# Patient Record
Sex: Female | Born: 1995 | Race: White | Hispanic: Yes | Marital: Single | Smoking: Never smoker
Health system: Southern US, Community
[De-identification: ages and names within clinical notes are randomized; demographics above are authoritative.]

## PROBLEM LIST (undated history)

## (undated) HISTORY — PX: APPENDECTOMY: SHX54

---
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
## 2018-05-04 DIAGNOSIS — J309 Allergic rhinitis, unspecified: Secondary | ICD-10-CM | POA: Diagnosis not present

## 2018-05-04 DIAGNOSIS — H6692 Otitis media, unspecified, left ear: Secondary | ICD-10-CM | POA: Diagnosis not present

## 2018-10-12 ENCOUNTER — Encounter: Payer: Self-pay | Admitting: Certified Nurse Midwife

## 2018-10-12 ENCOUNTER — Ambulatory Visit (INDEPENDENT_AMBULATORY_CARE_PROVIDER_SITE_OTHER): Payer: BLUE CROSS/BLUE SHIELD | Admitting: Certified Nurse Midwife

## 2018-10-12 VITALS — BP 95/64 | HR 61 | Ht 62.0 in | Wt 139.8 lb

## 2018-10-12 DIAGNOSIS — N898 Other specified noninflammatory disorders of vagina: Secondary | ICD-10-CM

## 2018-10-12 DIAGNOSIS — Z01419 Encounter for gynecological examination (general) (routine) without abnormal findings: Secondary | ICD-10-CM

## 2018-10-12 DIAGNOSIS — Z113 Encounter for screening for infections with a predominantly sexual mode of transmission: Secondary | ICD-10-CM

## 2018-10-12 DIAGNOSIS — Z124 Encounter for screening for malignant neoplasm of cervix: Secondary | ICD-10-CM

## 2018-10-12 DIAGNOSIS — N939 Abnormal uterine and vaginal bleeding, unspecified: Secondary | ICD-10-CM | POA: Insufficient documentation

## 2018-10-12 LAB — POCT URINE PREGNANCY: Preg Test, Ur: NEGATIVE

## 2018-10-12 NOTE — Progress Notes (Signed)
GYNECOLOGY ANNUAL PREVENTATIVE CARE ENCOUNTER NOTE  Subjective:   Tillman AbideMaria Wynder is a 23 y.o. G0P0000 female here for a routine annual gynecologic exam.  Current complaints: occasional pelvic pain and AUB.   Denies discharge, problems with intercourse or other gynecologic concerns. Patient request STD screening.    Gynecologic History Patient's last menstrual period was 08/08/2018 (exact date). Contraception: none Never had pap prior to today   Obstetric History OB History  Gravida Para Term Preterm AB Living  0 0 0 0 0 0  SAB TAB Ectopic Multiple Live Births  0 0 0 0 0    History reviewed. No pertinent past medical history.  Past Surgical History:  Procedure Laterality Date  . APPENDECTOMY      Current Outpatient Medications on File Prior to Visit  Medication Sig Dispense Refill  . predniSONE (DELTASONE) 20 MG tablet Take one pill in AM and two pills in PM for 5 days, then two pills once a day for five days then 1 pill daily for 4 days. (Patient not taking: Reported on 10/12/2018) 34 tablet 0   No current facility-administered medications on file prior to visit.     No Known Allergies  Social History:  reports that she has never smoked. She has never used smokeless tobacco. She reports that she does not drink alcohol or use drugs.  Family History  Problem Relation Age of Onset  . Heart disease Maternal Grandmother   . Diabetes Paternal Grandmother     The following portions of the patient's history were reviewed and updated as appropriate: allergies, current medications, past family history, past medical history, past social history, past surgical history and problem list.  Review of Systems Pertinent items noted in HPI and remainder of comprehensive ROS otherwise negative.   Objective:  BP 95/64   Pulse 61   Ht 5\' 2"  (1.575 m)   Wt 139 lb 12.8 oz (63.4 kg)   LMP 08/08/2018 (Exact Date) Comment: i  BMI 25.57 kg/m  CONSTITUTIONAL: Well-developed,  well-nourished female in no acute distress.  HENT:  Normocephalic, atraumatic, External right and left ear normal. Oropharynx is clear and moist EYES: Conjunctivae and EOM are normal. Pupils are equal, round, and reactive to light.  NECK: Normal range of motion, supple, no masses.  Normal thyroid.  SKIN: Skin is warm and dry. No rash noted. Not diaphoretic. No erythema. No pallor. MUSCULOSKELETAL: Normal range of motion. No tenderness.  No cyanosis, clubbing, or edema.  2+ distal pulses. NEUROLOGIC: Alert and oriented to person, place, and time. Normal reflexes, muscle tone coordination. No cranial nerve deficit noted. PSYCHIATRIC: Normal mood and affect. Normal behavior. Normal judgment and thought content. CARDIOVASCULAR: Normal heart rate noted, regular rhythm RESPIRATORY: Clear to auscultation bilaterally. Effort and breath sounds normal, no problems with respiration noted. BREASTS: Symmetric in size. No masses, skin changes, nipple drainage, or lymphadenopathy. ABDOMEN: Soft, normal bowel sounds, no distention noted.  No tenderness, rebound or guarding.  PELVIC: Normal appearing external genitalia; normal appearing vaginal mucosa and cervix.  Small amount of white thin discharge noted.  Pap smear obtained.  Normal uterine size, no other palpable masses, no uterine or adnexal tenderness.  Assessment and Plan:  1. Encounter for annual routine gynecological examination - Normal well woman examination  - Reports intense pelvic cramping each month with variation of cycles  - Birth control options discussed in detail for AUB and painful cycles, patient declines   2. Screening for cervical cancer - Cytology - PAP( [facility])  3. Screening examination for STD (sexually transmitted disease) - Patient request STD screening  - Hepatitis B surface antigen - Hepatitis C antibody - HIV Antibody (routine testing w rflx) - RPR  4. Abnormal uterine bleeding (AUB) - patient reports hx of AUB  since menarche  - Was on OCP which made irregular periods worse per patient  - Educated and discussed other birth control options in detail, patient declines and does not want to be on [facility].  - POCT urine pregnancy  Will follow up results of pap smear and manage accordingly. Routine preventative health maintenance measures emphasized. Please refer to After Visit Summary for other counseling recommendations.    Sharyon Cable, CNM [facility] [employer], [facility]

## 2018-10-12 NOTE — Progress Notes (Signed)
NGYN patient presents for Annual Exam.  CC: Pelvic Pain that is just random pain will be 6/10x, pt describes pain as "aching". Pt denies any dysuria symptoms     LMP : 08/08/2018 irregular periods  Contraception: None and none desired  STD Screening: Desires Full Panel.

## 2018-10-13 LAB — HEPATITIS B SURFACE ANTIGEN: Hepatitis B Surface Ag: NEGATIVE

## 2018-10-13 LAB — RPR: RPR Ser Ql: NONREACTIVE

## 2018-10-13 LAB — HIV ANTIBODY (ROUTINE TESTING W REFLEX): HIV Screen 4th Generation wRfx: NONREACTIVE

## 2018-10-13 LAB — HEPATITIS C ANTIBODY: Hep C Virus Ab: <0.1 s/co ratio (ref 0.0–0.9)

## 2018-10-17 LAB — CYTOLOGY - PAP
Bacterial vaginitis: NEGATIVE
Candida vaginitis: NEGATIVE
Chlamydia: NEGATIVE
Diagnosis: NEGATIVE
Neisseria Gonorrhea: NEGATIVE
Trichomonas: NEGATIVE

## 2019-07-17 ENCOUNTER — Other Ambulatory Visit: Payer: Self-pay

## 2019-07-18 ENCOUNTER — Other Ambulatory Visit (HOSPITAL_COMMUNITY)
Admission: RE | Admit: 2019-07-18 | Discharge: 2019-07-18 | Disposition: A | Discharge status: Home / Self Care | Payer: BC Managed Care – PPO | Source: Ambulatory Visit | Attending: Certified Nurse Midwife | Admitting: Certified Nurse Midwife

## 2019-07-18 ENCOUNTER — Encounter: Payer: Self-pay | Admitting: Certified Nurse Midwife

## 2019-07-18 ENCOUNTER — Other Ambulatory Visit: Payer: Self-pay

## 2019-07-18 ENCOUNTER — Ambulatory Visit (INDEPENDENT_AMBULATORY_CARE_PROVIDER_SITE_OTHER): Payer: BC Managed Care – PPO | Admitting: Certified Nurse Midwife

## 2019-07-18 VITALS — BP 110/64 | HR 64 | Temp 97.1°F | Resp 16 | Ht 61.75 in | Wt 134.0 lb

## 2019-07-18 DIAGNOSIS — Z113 Encounter for screening for infections with a predominantly sexual mode of transmission: Secondary | ICD-10-CM

## 2019-07-18 DIAGNOSIS — Z01419 Encounter for gynecological examination (general) (routine) without abnormal findings: Secondary | ICD-10-CM

## 2019-07-18 DIAGNOSIS — Z124 Encounter for screening for malignant neoplasm of cervix: Secondary | ICD-10-CM | POA: Insufficient documentation

## 2019-07-18 DIAGNOSIS — Z0000 Encounter for general adult medical examination without abnormal findings: Secondary | ICD-10-CM

## 2019-07-18 DIAGNOSIS — R8290 Unspecified abnormal findings in urine: Secondary | ICD-10-CM

## 2019-07-18 LAB — POCT URINALYSIS DIPSTICK
Bilirubin, UA: NEGATIVE
Glucose, UA: NEGATIVE
Ketones, UA: NEGATIVE
Leukocytes, UA: NEGATIVE
Nitrite, UA: POSITIVE
Protein, UA: POSITIVE — AB
Urobilinogen, UA: NEGATIVE E.U./dL — AB
pH, UA: 5 (ref 5.0–8.0)

## 2019-07-18 NOTE — Progress Notes (Addendum)
23 y.o. G0P0000 Single  Hispanic Fe here to establish gyn care and for annual exam. Contraception is Withdrawal, which has worked well for 2 years. Periods are usually monthly and has some irregular periods of missing 1-2 months in the past year.Marland Kitchen Has history of no period for 5 months when she was underweight only. Much better now. Has noted some Urinary frequency and urine odor the past few days. No fever, chills or back pain or blood in urine. Has never had STD screening she has only been one partner, but he has been with more than patient. Request STD screening. No other health concerns today.  Patient's last menstrual period was 07/14/2019 (exact date).          Sexually active: Yes.    The current method of family planning is withdrawal    Exercising: No.  exercise Smoker:  no  Review of Systems  Constitutional:       Irregular menstrual cycles  Genitourinary:       Urine odor    Health Maintenance: Pap:  2019 neg per patient History of Abnormal Pap: no MMG:  none Self Breast exams: no Colonoscopy:  none BMD:   none TDaP:  UTD Shingles: no Pneumonia: no Hep C and HIV: not done Labs: poct urine-nitrite +, rbc 2+(on cycle), protein+   reports that she has never smoked. She has never used smokeless tobacco. She reports that she does not drink alcohol or use drugs.  History reviewed. No pertinent past medical history.  Past Surgical History:  Procedure Laterality Date  . APPENDECTOMY      No current outpatient medications on file.   No current facility-administered medications for this visit.     Family History  Problem Relation Age of Onset  . Heart disease Maternal Grandmother   . Diabetes Paternal Grandmother     ROS:  Pertinent items are noted in HPI.  Otherwise, a comprehensive ROS was negative.  Exam:   BP 110/64   Pulse 64   Temp (!) 97.1 F (36.2 C) (Skin)   Resp 16   Ht 5' 1.75" (1.568 m)   Wt 134 lb (60.8 kg)   LMP 07/14/2019 (Exact Date)   BMI  24.71 kg/m  Height: 5' 1.75" (156.8 cm) Ht Readings from Last 3 Encounters:  07/18/19 5' 1.75" (1.568 m)  10/12/18 5\' 2"  (1.575 m)    General appearance: alert, cooperative and appears stated age Head: Normocephalic, without obvious abnormality, atraumatic Neck: no adenopathy, supple, symmetrical, trachea midline and thyroid normal to inspection and palpation Lungs: clear to auscultation bilaterally CVAT bilateral negative Breasts: normal appearance, no masses or tenderness, No nipple retraction or dimpling, No nipple discharge or bleeding, No axillary or supraclavicular adenopathy, Taught monthly breast self examination, printed information given Heart: regular rate and rhythm Abdomen: soft, non-tender; no masses,  no organomegaly, positive for suprapubic tenderness Extremities: extremities normal, atraumatic, no cyanosis or edema Skin: Skin color, texture, turgor normal. No rashes or lesions Lymph nodes: Cervical, supraclavicular, and axillary nodes normal. No abnormal inguinal nodes palpated Neurologic: Grossly normal   Pelvic: External genitalia:  no lesions              Urethra:  normal appearing urethra with no masses, or lesions  Positive for tenderness  Bladder tender, Urethra meatus tender,              Bartholin's and Skene's: normal  Vagina: normal appearing vagina with normal color and discharge, no lesions              Cervix: no cervical motion tenderness, no lesions, nulliparous appearance and blood noted from cervix              Pap taken: Yes.   Bimanual Exam:  Uterus:  normal size, contour, position, consistency, mobility, non-tender and anteverted              Adnexa: normal adnexa and no mass, fullness, tenderness               Rectovaginal: Confirms               Anus:  normal appearance, no lesions  Chaperone present: yes  A:  Well Woman with normal exam  Contraception withdrawal  History of 35-38 day menstrual cycles  UTI  STD  screening  ? Gardasil candidate  P:   Reviewed health and wellness pertinent to exam Discussed condom use also for protection.  Discussed keeping menses record and if misses more than 3 cycles needs to advise. Can consider OCP for cycle control. Patient will consider.  Discussed finding consistent with UTI and medication needed. Warning signs with UTI given, patient declined medication until lab in.  Lab: Urine micro and culture  Discussed prevention with condom use and monogamous relationship  ZOX:WRUEAV, Gc,Chlamydia, trichomonas,HIV,RPR, Hep.C  Patient to check with mother regarding vaccines and Gardasil..  Pap smear: yes  counseled on breast self exam, STD prevention, HIV risk factors and prevention, feminine hygiene, family planning choices, adequate intake of calcium and vitamin D, diet and exercise  return annually or prn  An After Visit Summary was printed and given to the patient.

## 2019-07-18 NOTE — Patient Instructions (Signed)
General topics  Next pap or exam is  due in 1 year Take a Women's multivitamin Take 1200 mg. of calcium daily - prefer dietary If any concerns in interim to call back  Breast Self-Awareness Practicing breast self-awareness may pick up problems early, prevent significant medical complications, and possibly save your life. By practicing breast self-awareness, you can become familiar with how your breasts look and feel and if your breasts are changing. This allows you to notice changes early. It can also offer you some reassurance that your breast health is good. One way to learn what is normal for your breasts and whether your breasts are changing is to do a breast self-exam. If you find a lump or something that was not present in the past, it is best to contact your caregiver right away. Other findings that should be evaluated by your caregiver include nipple discharge, especially if it is bloody; skin changes or reddening; areas where the skin seems to be pulled in (retracted); or new lumps and bumps. Breast pain is seldom associated with cancer (malignancy), but should also be evaluated by a caregiver. BREAST SELF-EXAM The best time to examine your breasts is 5 7 days after your menstrual period is over.  ExitCare Patient Information 2013 [place].   Exercise to Stay Healthy Exercise helps you become and stay healthy. EXERCISE IDEAS AND TIPS Choose exercises that:  You enjoy.  Fit into your day. You do not need to exercise really hard to be healthy. You can do exercises at a slow or medium level and stay healthy. You can:  Stretch before and after working out.  Try yoga, Pilates, or tai chi.  Lift weights.  Walk fast, swim, jog, run, climb stairs, bicycle, dance, or rollerskate.  Take aerobic classes. Exercises that burn about 150 calories:  Running 1  miles in 15 minutes.  Playing volleyball for 45 to 60 minutes.  Washing and waxing a car for 45 to 60 minutes.   Playing touch football for 45 minutes.  Walking 1  miles in 35 minutes.  Pushing a stroller 1  miles in 30 minutes.  Playing basketball for 30 minutes.  Raking leaves for 30 minutes.  Bicycling 5 miles in 30 minutes.  Walking 2 miles in 30 minutes.  Dancing for 30 minutes.  Shoveling snow for 15 minutes.  Swimming laps for 20 minutes.  Walking up stairs for 15 minutes.  Bicycling 4 miles in 15 minutes.  Gardening for 30 to 45 minutes.  Jumping rope for 15 minutes.  Washing windows or floors for 45 to 60 minutes. Document Released: 10/10/2010 Document Revised: 11/30/2011 Document Reviewed: 10/10/2010 [facility] Patient Information 2013 [place].   Other topics ( that may be useful information):    Sexually Transmitted Disease Sexually transmitted disease (STD) refers to any infection that is passed from person to person during sexual activity. This may happen by way of saliva, semen, blood, vaginal mucus, or urine. Common STDs include:  Gonorrhea.  Chlamydia.  Syphilis.  HIV/AIDS.  Genital herpes.  Hepatitis B and C.  Trichomonas.  Human papillomavirus (HPV).  Pubic lice. CAUSES  An STD may be spread by bacteria, virus, or parasite. A person can get an STD by:  Sexual intercourse with an infected person.  Sharing sex toys with an infected person.  Sharing needles with an infected person.  Having intimate contact with the genitals, mouth, or rectal areas of an infected person. SYMPTOMS  Some people may not have any symptoms, but  they can still pass the infection to others. Different STDs have different symptoms. Symptoms include:  Painful or bloody urination.  Pain in the pelvis, abdomen, vagina, anus, throat, or eyes.  Skin rash, itching, irritation, growths, or sores (lesions). These usually occur in the genital or anal area.  Abnormal vaginal discharge.  Penile discharge in men.  Soft, flesh-colored skin growths in the genital  or anal area.  Fever.  Pain or bleeding during sexual intercourse.  Swollen glands in the groin area.  Yellow skin and eyes (jaundice). This is seen with hepatitis. DIAGNOSIS  To make a diagnosis, your caregiver may:  Take a medical history.  Perform a physical exam.  Take a specimen (culture) to be examined.  Examine a sample of discharge under a microscope.  Perform blood test TREATMENT   Chlamydia, gonorrhea, trichomonas, and syphilis can be cured with antibiotic medicine.  Genital herpes, hepatitis, and HIV can be treated, but not cured, with prescribed medicines. The medicines will lessen the symptoms.  Genital warts from HPV can be treated with medicine or by freezing, burning (electrocautery), or surgery. Warts may come back.  HPV is a virus and cannot be cured with medicine or surgery.However, abnormal areas may be followed very closely by your caregiver and may be removed from the cervix, vagina, or vulva through office procedures or surgery. If your diagnosis is confirmed, your recent sexual partners need treatment. This is true even if they are symptom-free or have a negative culture or evaluation. They should not have sex until their caregiver says it is okay. HOME CARE INSTRUCTIONS  All sexual partners should be informed, tested, and treated for all STDs.  Take your antibiotics as directed. Finish them even if you start to feel better.  Only take over-the-counter or prescription medicines for pain, discomfort, or fever as directed by your caregiver.  Rest.  Eat a balanced diet and drink enough fluids to keep your urine clear or pale yellow.  Do not have sex until treatment is completed and you have followed up with your caregiver. STDs should be checked after treatment.  Keep all follow-up appointments, Pap tests, and blood tests as directed by your caregiver.  Only use latex condoms and water-soluble lubricants during sexual activity. Do not use petroleum  jelly or oils.  Avoid alcohol and illegal drugs.  Get vaccinated for HPV and hepatitis. If you have not received these vaccines in the past, talk to your caregiver about whether one or both might be right for you.  Avoid risky sex practices that can break the skin. The only way to avoid getting an STD is to avoid all sexual activity.Latex condoms and dental dams (for oral sex) will help lessen the risk of getting an STD, but will not completely eliminate the risk. SEEK MEDICAL CARE IF:   You have a fever.  You have any new or worsening symptoms. Document Released: 11/28/2002 Document Revised: 11/30/2011 Document Reviewed: 12/05/2010 [facility] Patient Information 2013 [place].    Domestic Abuse You are being battered or abused if someone close to you hits, pushes, or physically hurts you in any way. You also are being abused if you are forced into activities. You are being sexually abused if you are forced to have sexual contact of any kind. You are being emotionally abused if you are made to feel worthless or if you are constantly threatened. It is important to remember that help is available. No one has the right to abuse you. PREVENTION OF FURTHER  ABUSE  Learn the warning signs of danger. This varies with situations but may include: the use of alcohol, threats, isolation from friends and family, or forced sexual contact. Leave if you feel that violence is going to occur.  If you are attacked or beaten, report it to the police so the abuse is documented. You do not have to press charges. The police can protect you while you or the attackers are leaving. Get the officer's name and badge number and a copy of the report.  Find someone you can trust and tell them what is happening to you: your caregiver, a nurse, clergy member, close friend or family member. Feeling ashamed is natural, but remember that you have done nothing wrong. No one deserves abuse. Document Released: 09/04/2000  Document Revised: 11/30/2011 Document Reviewed: 11/13/2010 [facility] Patient Information 2013 [place].    How Much is Too Much Alcohol? Drinking too much alcohol can cause injury, accidents, and health problems. These types of problems can include:   Car crashes.  Falls.  Family fighting (domestic violence).  Drowning.  Fights.  Injuries.  Burns.  Damage to certain organs.  Having a baby with birth defects. ONE DRINK CAN BE TOO MUCH WHEN YOU ARE:  Working.  Pregnant or breastfeeding.  Taking medicines. Ask your doctor.  Driving or planning to drive. If you or someone you know has a drinking problem, get help from a doctor.  Document Released: 07/04/2009 Document Revised: 11/30/2011 Document Reviewed: 07/04/2009 [facility] Patient Information 2013 [place].   Smoking Hazards Smoking cigarettes is extremely bad for your health. Tobacco smoke has over 200 known poisons in it. There are over 60 chemicals in tobacco smoke that cause cancer. Some of the chemicals found in cigarette smoke include:   Cyanide.  Benzene.  Formaldehyde.  Methanol (wood alcohol).  Acetylene (fuel used in welding torches).  Ammonia. Cigarette smoke also contains the poisonous gases nitrogen oxide and carbon monoxide.  Cigarette smokers have an increased risk of many serious medical problems and Smoking causes approximately:  90% of all lung cancer deaths in men.  80% of all lung cancer deaths in women.  90% of deaths from chronic obstructive lung disease. Compared with nonsmokers, smoking increases the risk of:  Coronary heart disease by 2 to 4 times.  Stroke by 2 to 4 times.  Men developing lung cancer by 23 times.  Women developing lung cancer by 13 times.  Dying from chronic obstructive lung diseases by 12 times.  . Smoking is the most preventable cause of death and disease in our society.  WHY IS SMOKING ADDICTIVE?  Nicotine is the chemical agent in  tobacco that is capable of causing addiction or dependence.  When you smoke and inhale, nicotine is absorbed rapidly into the bloodstream through your lungs. Nicotine absorbed through the lungs is capable of creating a powerful addiction. Both inhaled and non-inhaled nicotine may be addictive.  Addiction studies of cigarettes and spit tobacco show that addiction to nicotine occurs mainly during the teen years, when young people begin using tobacco products. WHAT ARE THE BENEFITS OF QUITTING?  There are many health benefits to quitting smoking.   Likelihood of developing cancer and heart disease decreases. Health improvements are seen almost immediately.  Blood pressure, pulse rate, and breathing patterns start returning to normal soon after quitting. QUITTING SMOKING   American Lung Association - 1-800-LUNGUSA  American Cancer Society - 1-800-ACS-2345 Document Released: 10/15/2004 Document Revised: 11/30/2011 Document Reviewed: 06/19/2009 [facility] Patient Information 2013 [place].   Stress Management Stress is a state of physical or mental tension that often results from changes in your life or normal routine. Some common causes of stress are:  Death of a loved one.  Injuries or severe illnesses.  Getting fired or changing jobs.  Moving into a new home. Other causes may be:  Sexual problems.  Business or financial losses.  Taking on a large debt.  Regular conflict with someone at home or at work.  Constant tiredness from lack of sleep. It is not just bad things that are stressful. It may be stressful to:  Win the lottery.  Get married.  Buy a new car. The amount of stress that can be easily tolerated varies from person to person. Changes generally cause stress, regardless of the types of change. Too much stress can affect your health. It may lead to physical or emotional problems. Too little stress (boredom) may also become stressful. SUGGESTIONS TO REDUCE  STRESS:  Talk things over with your family and friends. It often is helpful to share your concerns and worries. If you feel your problem is serious, you may want to get help from a professional counselor.  Consider your problems one at a time instead of lumping them all together. Trying to take care of everything at once may seem impossible. List all the things you need to do and then start with the most important one. Set a goal to accomplish 2 or 3 things each day. If you expect to do too many in a single day you will naturally fail, causing you to feel even more stressed.  Do not use alcohol or drugs to relieve stress. Although you may feel better for a short time, they do not remove the problems that caused the stress. They can also be habit forming.  Exercise regularly - at least 3 times per week. Physical exercise can help to relieve that "uptight" feeling and will relax you.  The shortest distance between despair and hope is often a good night's sleep.  Go to bed and get up on time allowing yourself time for appointments without being rushed.  Take a short "time-out" period from any stressful situation that occurs during the day. Close your eyes and take some deep breaths. Starting with the muscles in your face, tense them, hold it for a few seconds, then relax. Repeat this with the muscles in your neck, shoulders, hand, stomach, back and legs.  Take good care of yourself. Eat a balanced diet and get plenty of rest.  Schedule time for having fun. Take a break from your daily routine to relax. HOME CARE INSTRUCTIONS   Call if you feel overwhelmed by your problems and feel you can no longer manage them on your own.  Return immediately if you feel like hurting yourself or someone else. Document Released: 03/03/2001 Document Revised: 11/30/2011 Document Reviewed: 10/24/2007 [facility] Patient Information 2013 [place].   Infeccin urinaria en los adultos Urinary Tract Infection,  Adult Una infeccin urinaria (IU) puede ocurrir en Clinical cytogeneticist de las vas [place]. Las vas urinarias incluyen lo siguiente:  Los riones.  Los urteres.  La vejiga.  La uretra. Estos rganos fabrican, almacenan y eliminan el pis (orina) del cuerpo. Cules son las causas? La causa es la presencia de grmenes (bacterias) en la zona genital. Estos grmenes proliferan y causan hinchazn (inflamacin) de las vas urinarias. Qu incrementa el riesgo? Es ms probable que contraiga esta afeccin si:  Tiene colocado un tubo delgado  y pequeo (catter) para drenar el pis.  Nopuede controlar la evacuacin de pis ni de materia fecal (incontinencia).  Es Art therapist y, adems: ? [place] estos mtodos para Therapist, occupational: ? Un medicamento que [employer] espermatozoides (espermicida). ? Un dispositivo que impide el paso de los espermatozoides (diafragma). ? Tieneniveles bajos de una hormona femenina (estrgeno). ? Est embarazada.  Tiene genes que [employer].  Es sexualmente activa.  Toma antibiticos.  Tiene dificultad para orinar debido a: ? Su prstata es ms grande de lo normal, si usted es hombre. ? Obstruccin en la parte del cuerpo que drena el pis de la vejiga (uretra). ? Clculo renal. ? Untrastorno nervioso que afecta la vejiga (vejiga neurgena). ? No bebe una cantidad suficiente de lquido. ? No hace pis con la frecuencia suficiente.  Tiene otras afecciones, como: ? Diabetes. ? Un sistema que combate las enfermedades (sistema inmunitario) debilitado. ? Anemia drepanoctica. ? Gota. ? Lesin en la columna vertebral. Cules son los signos o los sntomas? Los sntomas de esta afeccin incluyen:  Necesidad inmediata (urgente) de hacer pis.  Hacer pis con frecuencia.  Hacer poca cantidad de pis con mucha frecuencia.  Dolor o ardor al [employer].  Sangre en el pis.  Pis que huele mal o anormal.  Dificultad para hacer pis.  Pis turbio.  Lquido que  sale de la vagina, si es [place].  Dolor en la barriga o en la parte baja de la espalda. Otros sntomas pueden incluir los siguientes:  Vmitos.  No sentir deseos de comer.  Sentirse confundido (confuso).  Sentirse cansado y malhumorado (irritable).  Cristy Hilts.  Materia fecal lquida (diarrea). Cmo se trata? El tratamiento de esta afeccin puede incluir:  Antibiticos.  Otros medicamentos.  Beber una cantidad suficiente de agua. Sigue estas instrucciones en tu casa:  Medicamentos  [employer] de venta libre y los recetados solamente como se lo haya indicado el mdico.  Si le recetaron un antibitico, tmelo como se lo haya indicado el mdico. No deje de tomarlo aunque comience a sentirse mejor. Indicaciones generales  Asegrese de hacer lo siguiente: ? Haga pis hasta que la vejiga quede vaca. ? Nocontenga el pis durante mucho tiempo. ? Vace la vejiga despus de Clinical biochemist. ? Lmpiese de adelante hacia atrs despus de defecar, si es mujer. Use cada trozo de papel higinico solo una vez cuando se limpie.  Beba suficiente lquido como para Theatre manager la orina de color [place] plido.  Concurra a todas las visitas de seguimiento como se lo haya indicado el mdico. Esto es importante. Comunquese con un mdico si:  No mejora despus de 1 o 2das de tratamiento.  Los sntomas desaparecen y Teacher, adult education. Solicite ayuda inmediatamente si:  Tiene un dolor muy intenso en la espalda.  Tiene dolor muy intenso en la parte baja de la barriga.  Tener fiebre.  Tiene Higher education careers adviser (nuseas).  Tiene vmitos. Resumen  Una infeccin urinaria (IU) puede ocurrir en Clinical cytogeneticist de las vas [place].  Esta afeccin es causada por la presencia de grmenes en la zona genital.  Existen muchos factores de riesgo de sufrir una IU. Estos incluyen tener colocado un tubo delgado y pequeo para drenar el pis y no poder controlar cundo hace pis y  materia fecal.  El tratamiento incluye antibiticos contra los grmenes.  Beba suficiente lquido como para Theatre manager la orina de color [place] plido. Esta informacin no tiene Marine scientist el consejo del mdico. Asegrese de hacerle al mdico  cualquier pregunta que tenga. Document Released: 02/25/2010 Document Revised: 08/31/2018 Document Reviewed: 08/31/2018 Elsevier Patient Education  2020 [place] Sex Practicing safe sex means taking steps before and during sex to reduce your risk of:  Getting an STI (sexually transmitted infection).  Giving your partner an STI.  Unwanted or unplanned pregnancy. How can I practice safe sex?     Ways you can practice safe sex  Limit your sexual partners to only one partner who is having sex with only you.  Avoid using alcohol and drugs before having sex. Alcohol and drugs can affect your judgment.  Before having sex with a new partner: ? Talk to your partner about past partners, past STIs, and drug use. ? Get screened for STIs and discuss the results with your partner. Ask your partner to get screened, too.  Check your body regularly for sores, blisters, rashes, or unusual discharge. If you notice any of these problems, visit your health care provider.  Avoid sexual contact if you have symptoms of an infection or you are being treated for an STI.  While having sex, use a condom. Make sure to: ? Use a condom every time you have vaginal, oral, or anal sex. Both females and males should wear condoms during oral sex. ? Keep condoms in place from the beginning to the end of sexual activity. ? Use a latex condom, if possible. Latex condoms offer the best protection. ? Use only water-based lubricants with a condom. Using petroleum-based lubricants or oils will weaken the condom and increase the chance that it will break. Ways your health care provider can help you practice safe sex  See your health care provider for regular  screenings, exams, and tests for STIs.  Talk with your health care provider about what kind of birth control (contraception) is best for you.  Get vaccinated against hepatitis B and human papillomavirus (HPV).  If you are at risk of being infected with HIV (human immunodeficiency virus), talk with your health care provider about taking a prescription medicine to prevent HIV infection. You are at risk for HIV if you: ? Are a man who has sex with other men. ? Are sexually active with more than one partner. ? Take drugs by injection. ? Have a sex partner who has HIV. ? Have unprotected sex. ? Have sex with someone who has sex with both men and women. ? Have had an STI. Follow these instructions at home:  Take over-the-counter and prescription medicines as told by your health care provider.  Keep all follow-up visits as told by your health care provider. This is important. Where to find more information  Centers for Disease Control and Prevention: PinkCheek.be  Planned Parenthood: https://www.plannedparenthood.org/  Office on Women's Health: BasketballVoice.it Summary  Practicing safe sex means taking steps before and during sex to reduce your risk of STIs, giving your partner STIs, and having an unwanted or unplanned pregnancy.  Before having sex with a new partner, talk to your partner about past partners, past STIs, and drug use.  Use a condom every time you have vaginal, oral, or anal sex. Both females and males should wear condoms during oral sex.  Check your body regularly for sores, blisters, rashes, or unusual discharge. If you notice any of these problems, visit your health care provider.  See your health care provider for regular screenings, exams, and tests for STIs. This information is not intended to replace advice given to you by your health care provider. Make  sure you discuss any  questions you have with your health care provider. Document Released: 10/15/2004 Document Revised: 12/30/2018 Document Reviewed: 06/20/2018 Elsevier Patient Education  2020 [employer].

## 2019-07-19 ENCOUNTER — Telehealth: Payer: Self-pay

## 2019-07-19 LAB — RPR: RPR Ser Ql: NONREACTIVE

## 2019-07-19 LAB — HEPATITIS C ANTIBODY: Hep C Virus Ab: <0.1 s/co ratio (ref 0.0–0.9)

## 2019-07-19 LAB — URINALYSIS, MICROSCOPIC ONLY: Casts: NONE SEEN /lpf

## 2019-07-19 LAB — HIV ANTIBODY (ROUTINE TESTING W REFLEX): HIV Screen 4th Generation wRfx: NONREACTIVE

## 2019-07-19 NOTE — Telephone Encounter (Signed)
Left message for call back.

## 2019-07-19 NOTE — Telephone Encounter (Signed)
-----   Message from Regina Eck, CNM sent at 07/19/2019  3:01 PM EDT ----- Notify patient her urine microscopic was positive for WBC and RBC consistent with infection. Patient status? Culture pending. RPR, HIV, Hep C is negative

## 2019-07-20 MED ORDER — SULFAMETHOXAZOLE-TRIMETHOPRIM 800-160 MG PO TABS: 1.0000 | ORAL_TABLET | Freq: Two times a day (BID) | ORAL | 0 refills | Status: AC

## 2019-07-20 NOTE — Telephone Encounter (Signed)
Spoke with patient, advised per Dr. Quincy Simmonds. Rx for Bactrim DS to verified pharmacy. Patient verbalizes understanding and is agreeable.   Encounter closed.

## 2019-07-20 NOTE — Telephone Encounter (Signed)
UC reviewed showing infection with gram negative rods.  OK to start antibiotic, Bactrim DS po bid x 3 days.  When final urine culture is ready, patient will be contacted.  Sometimes, we need to change the antibiotic after report is finalized.  Cc - Evalee Mutton

## 2019-07-20 NOTE — Telephone Encounter (Signed)
Patient returning call. Says she was asked to speak with the triage nurse today.

## 2019-07-20 NOTE — Telephone Encounter (Signed)
Spoke with patient, advised per Melvia Heaps, CNM. Patient reports urinary frequency and odorous urine not resolved. Patient states she was not started on abx, waiting on results. Denies any new symptoms. Advised patient I will review UC results with covering provider and return call, patient agreeable.   Dr. Quincy Simmonds -please review preliminary UC results and advise.   Cc:  Melvia Heaps, CNM

## 2019-07-22 LAB — URINE CULTURE

## 2019-07-24 ENCOUNTER — Other Ambulatory Visit: Payer: Self-pay | Admitting: Certified Nurse Midwife

## 2019-07-24 ENCOUNTER — Telehealth: Payer: Self-pay

## 2019-07-24 LAB — CYTOLOGY - PAP
Chlamydia: NEGATIVE
Comment: NEGATIVE
Comment: NEGATIVE
Comment: NORMAL
Diagnosis: NEGATIVE
Neisseria Gonorrhea: NEGATIVE
Trichomonas: NEGATIVE

## 2019-07-24 MED ORDER — SULFAMETHOXAZOLE-TRIMETHOPRIM 800-160 MG PO TABS: 1.0000 | ORAL_TABLET | Freq: Two times a day (BID) | ORAL | 0 refills | Status: AC

## 2019-07-24 NOTE — Telephone Encounter (Signed)
-----   Message from Regina Eck, CNM sent at 07/23/2019  5:15 PM EST ----- Notify patient her culture showed E.Coli and the medication she was taking should have cleared the infection. Patient status?

## 2019-07-24 NOTE — Telephone Encounter (Signed)
If she has been taking Ampicillin per pharmacy record this may have cleared the infection. Was she still having symptoms?

## 2019-07-24 NOTE — Telephone Encounter (Signed)
Left message for call back.

## 2019-07-24 NOTE — Telephone Encounter (Signed)
Patient was talking with Joy earlier today regarding her results.  She has been unable to find the medication prescribed to her. She asked if another prescription could be called to her pharmacy [facility] [place].

## 2019-07-24 NOTE — Telephone Encounter (Signed)
Patient notified of results. See lab 

## 2019-07-24 NOTE — Telephone Encounter (Signed)
Call reviewed with Melvia Heaps, CNM. Call returned to patient.   Patient completed ampicillin on 11/1 prescribed by dentist. Patient states pain with urination and odor have not resolved or improved, no change. Advised Rx for Bactrim DS has been sent to Pharmacy on file. Patient verbalizes understanding and is agreeable.   Routing to provider for final review. Patient is agreeable to disposition. Will close encounter.

## 2019-07-24 NOTE — Telephone Encounter (Signed)
Per review of lab result note dated 07/24/19, patient lost RX.   Rx pended for Bactrim DS, bid x3 days.   Routing to Melvia Heaps, CNM to advise on RX.

## 2019-07-25 ENCOUNTER — Telehealth: Payer: Self-pay

## 2019-07-25 NOTE — Telephone Encounter (Signed)
Patient returned call

## 2019-07-25 NOTE — Telephone Encounter (Signed)
Patient notified of results. See lab 

## 2019-07-25 NOTE — Telephone Encounter (Signed)
-----   Message from Regina Eck, CNM sent at 07/24/2019  4:43 PM EST ----- Notify patient her GC,Chlamydia, and trichomonas screen was negative Pap smear negative 02

## 2019-07-25 NOTE — Telephone Encounter (Signed)
Left message for call back.

## 2019-12-13 ENCOUNTER — Encounter: Payer: Self-pay | Admitting: Certified Nurse Midwife

## 2020-01-19 ENCOUNTER — Telehealth: Payer: Self-pay

## 2020-01-19 NOTE — Telephone Encounter (Signed)
Call to patient. Patient states that she had a bowel movement today and noticed "a little bit" of blood in her stool. Has never experienced this before. LMP was 12-18-19. States she did had abdominal pain 5/10 on Wednesday, but has resolved today. Also complaining of intermittent nausea, thinks related to appetite. States she did not have an appetite last week (only able to eat breakfast), but appetite has now returned. Denies vomiting, fever or chills. States she does not have a PCP. OV scheduled for Monday 01-22-20 at 1630 with Dr. Oscar La, patient agreeable to date and time of appointment. ER/UC precautions reviewed with patient for abdominal pain, N/V, fever and chills. Patient verbalized understanding.   Routing to provider and will close encounter.

## 2020-01-19 NOTE — Telephone Encounter (Signed)
Patient is calling in regards to having blood in stool. Patient would like to make an appointment.

## 2020-01-22 ENCOUNTER — Ambulatory Visit (INDEPENDENT_AMBULATORY_CARE_PROVIDER_SITE_OTHER): Payer: BC Managed Care – PPO | Admitting: Obstetrics and Gynecology

## 2020-01-22 ENCOUNTER — Encounter: Payer: Self-pay | Admitting: Obstetrics and Gynecology

## 2020-01-22 ENCOUNTER — Other Ambulatory Visit: Payer: Self-pay

## 2020-01-22 VITALS — BP 94/56 | HR 70 | Temp 98.1°F | Resp 14 | Ht 61.0 in | Wt 146.1 lb

## 2020-01-22 DIAGNOSIS — Z3169 Encounter for other general counseling and advice on procreation: Secondary | ICD-10-CM | POA: Diagnosis not present

## 2020-01-22 DIAGNOSIS — L700 Acne vulgaris: Secondary | ICD-10-CM

## 2020-01-22 DIAGNOSIS — K602 Anal fissure, unspecified: Secondary | ICD-10-CM | POA: Diagnosis not present

## 2020-01-22 DIAGNOSIS — N913 Primary oligomenorrhea: Secondary | ICD-10-CM | POA: Diagnosis not present

## 2020-01-22 DIAGNOSIS — N898 Other specified noninflammatory disorders of vagina: Secondary | ICD-10-CM

## 2020-01-22 NOTE — Patient Instructions (Addendum)
You can take metamucil (1-3 x a day) or magnesium 500 mg a day for 6 weeks to help the fissure heal.   Anal Fissure, Adult  An anal fissure is a small tear or crack in the tissue of the anus. Bleeding from a fissure usually stops on its own within a few minutes. However, bleeding will often occur again with each bowel movement until the fissure heals. What are the causes? This condition is usually caused by passing a large or hard stool (feces). Other causes include:  Constipation.  Frequent diarrhea.  Inflammatory bowel disease (Crohn's disease or ulcerative colitis).  Childbirth.  Infections.  Anal sex. What are the signs or symptoms? Symptoms of this condition include:  Bleeding from the rectum.  Small amounts of blood seen on your stool, on the toilet paper, or in the toilet after a bowel movement. The blood coats the outside of the stool and is not mixed with the stool.  Painful bowel movements.  Itching or irritation around the anus. How is this diagnosed? A health care provider may diagnose this condition by closely examining the anal area. An anal fissure can usually be seen with careful inspection. In some cases, a rectal exam may be performed, or a short tube (anoscope) may be used to examine the anal canal. How is this treated? Initial treatment for this condition may include:  Taking steps to avoid constipation. This may include making changes to your diet, such as increasing your intake of fiber or fluid.  Taking fiber supplements. These supplements can soften your stool to help make bowel movements easier. Your health care provider may also prescribe a stool softener if your stool is hard.  Taking sitz baths. This may help to heal the tear.  Using medicated creams or ointments. These may be prescribed to lessen discomfort. Treatments that are sometimes used if initial treatments do not work well or if the condition is more severe may include:  Botulinum  injection.  Surgery to repair the fissure. Follow these instructions at home: Eating and drinking   Avoid foods that may cause constipation, such as bananas, milk, and other dairy products.  Eat all fruits, except bananas.  Drink enough fluid to keep your urine pale yellow.  Eat foods that are high in fiber, such as beans, whole grains, and fresh fruits and vegetables. General instructions   Take over-the-counter and prescription medicines only as told by your health care provider.  Use creams or ointments only as told by your health care provider.  Keep the anal area clean and dry.  Take sitz baths as told by your health care provider. Do not use soap in the sitz baths.  Keep all follow-up visits as told by your health care provider. This is important. Contact a health care provider if you have:  More bleeding.  A fever.  Diarrhea that is mixed with blood.  Pain that continues.  Ongoing problems that are getting worse rather than better. Summary  An anal fissure is a small tear or crack in the tissue of the anus. This condition is usually caused by passing a large or hard stool (feces). Other causes include constipation and frequent diarrhea.  Initial treatment for this condition may include taking steps to avoid constipation, such as increasing your intake of fiber or fluid.  Follow instructions for care as told by your health care provider.  Contact your health care provider if you have more bleeding or your problem is getting worse rather than better.  Keep all follow-up visits as told by your health care provider. This is important. This information is not intended to replace advice given to you by your health care provider. Make sure you discuss any questions you have with your health care provider. Document Revised: 02/17/2018 Document Reviewed: 02/17/2018 Elsevier Patient Education  2020 [employer].  Clomiphene tablets What is this medicine? CLOMIPHENE  (KLOE mi feen) is a fertility drug that increases the chance of pregnancy. It helps women ovulate (produce a mature egg) during their cycle. This medicine may be used for other purposes; ask your health care provider or pharmacist if you have questions. COMMON BRAND NAME(S): Clomid, Serophene What should I tell my health care provider before I take this medicine? They need to know if you have any of these conditions:  adrenal gland disease  blood vessel disease or blood clots  cyst on the ovary  endometriosis  liver disease  ovarian cancer  pituitary gland disease  vaginal bleeding that has not been evaluated  an unusual or allergic reaction to clomiphene, other medicines, foods, dyes, or preservatives  pregnant (should not be used if you are already pregnant)  breast-feeding How should I use this medicine? Take this medicine by mouth with a glass of water. Follow the directions on the prescription label. Take exactly as directed for the exact number of days prescribed. Take your doses at regular intervals. Most women take this medicine for a 5 day period, but the length of treatment may be adjusted. Your doctor will give you a start date for this medication and will give you instructions on proper use. Do not take your medicine more often than directed. Talk to your pediatrician regarding the use of this medicine in children. Special care may be needed. Overdosage: If you think you have taken too much of this medicine contact a poison control center or emergency room at once. NOTE: This medicine is only for you. Do not share this medicine with others. What if I miss a dose? If you miss a dose, take it as soon as you can. If it is almost time for your next dose, take only that dose. Do not take double or extra doses. What may interact with this medicine?  herbal or dietary supplements, like blue cohosh, black cohosh, chasteberry, or DHEA  prasterone This list may not describe  all possible interactions. Give your health care provider a list of all the medicines, herbs, non-prescription drugs, or dietary supplements you use. Also tell them if you smoke, drink alcohol, or use illegal drugs. Some items may interact with your medicine. What should I watch for while using this medicine? Make sure you understand how and when to use this medicine. You need to know when you are ovulating and when to have sexual intercourse. This will increase the chance of a pregnancy. Visit your doctor or health care professional for regular checks on your progress. You may need tests to check the hormone levels in your blood or you may have to use home-urine tests to check for ovulation. Try to keep any appointments. Compared to other fertility treatments, this medicine does not greatly increase your chances of having multiple babies. An increased chance of having twins may occur in roughly 5 out of every 100 women who take this medication. Stop taking this medicine at once and contact your doctor or health care professional if you think you are pregnant. This medicine is not for long-term use. Most women that benefit from this medicine  do so within the first three cycles (months). Your doctor or health care professional will monitor your condition. This medicine is usually used for a total of 6 cycles of treatment. You may get drowsy or dizzy. Do not drive, use machinery, or do anything that needs mental alertness until you know how this drug affects you. Do not stand or sit up quickly. This reduces the risk of dizzy or fainting spells. Drinking alcoholic beverages or smoking tobacco may decrease your chance of becoming pregnant. Limit or stop alcohol and tobacco use during your fertility treatments. What side effects may I notice from receiving this medicine? Side effects that you should report to your doctor or health care professional as soon as possible:  allergic reactions like skin rash,  itching or hives, swelling of the face, lips, or tongue  breathing problems  changes in vision  fluid retention  nausea, vomiting  pelvic pain or bloating  severe abdominal pain  sudden weight gain Side effects that usually do not require medical attention (report to your doctor or health care professional if they continue or are bothersome):  breast discomfort  hot flashes  mild pelvic discomfort  mild nausea This list may not describe all possible side effects. Call your doctor for medical advice about side effects. You may report side effects to FDA at 1-800-FDA-1088. Where should I keep my medicine? Keep out of the reach of children. Store at room temperature between 15 and 30 degrees C (59 and 86 degrees F). Protect from heat, light, and moisture. Throw away any unused medicine after the expiration date. NOTE: This sheet is a summary. It may not cover all possible information. If you have questions about this medicine, talk to your doctor, pharmacist, or health care provider.  2020 Elsevier/Gold Standard (2007-12-19 22:21:06)  Female Infertility  Female infertility refers to a woman's inability to get pregnant (conceive) after a year of having sex regularly (or after 6 months in women over age 58) without using birth control. Infertility can also mean that a woman is not able to carry a pregnancy to full term. Both women and men can have fertility problems. What are the causes? This condition may be caused by:  Problems with reproductive organs. Infertility can result if a woman: ? Has an abnormally short cervix or a cervix that does not remain closed during a pregnancy. ? Has a blockage or scarring in the fallopian tubes. ? Has an abnormally shaped uterus. ? Has uterine fibroids. This is a benign mass of tissue or muscle (tumor) that can develop in the uterus. ? Is not ovulating in a regular way.  Certain medical conditions. These may include: ? Polycystic ovary  syndrome (PCOS). This is a hormonal disorder that can cause small cysts to grow on the ovaries. This is the most common cause of infertility in women. ? Endometriosis. This is a condition in which the tissue that lines the uterus (endometrium) grows outside of its normal location. ? Cancer and cancer treatments, such as chemotherapy or radiation. ? Premature ovarian failure. This is when ovaries stop producing eggs and hormones before age 60. ? Sexually transmitted diseases, such as chlamydia or gonorrhea. ? Autoimmune disorders. These are disorders in which the body's defense system (immune system) attacks normal, healthy cells. Infertility can be linked to more than one cause. For some women, the cause of infertility is not known (unexplained infertility). What increases the risk?  Age. A woman's fertility declines with age, especially after her mid-30s.  Being underweight or overweight.  Drinking too much alcohol.  Using drugs such as anabolic steroids, cocaine, and marijuana.  Exercising excessively.  Being exposed to environmental toxins, such as radiation, pesticides, and certain chemicals. What are the signs or symptoms? The main sign of infertility in women is the inability to get pregnant or carry a pregnancy to full term. How is this diagnosed? This condition may be diagnosed by:  Checking whether you are ovulating each month. The tests may include: ? Blood tests to check hormone levels. ? An ultrasound of the ovaries. ? Taking a small tissue that lines the uterus and checking it under a microscope (endometrial biopsy).  Doing additional tests. This is done if ovulation is normal. Tests may include: ? Hysterosalpingography. This X-ray test can show the shape of the uterus and whether the fallopian tubes are open. ? Laparoscopy. This test uses a lighted tube (laparoscope) to look for problems in the fallopian tubes and other organs. ? Transvaginal ultrasound. This imaging  test is used to check for abnormalities in the uterus and ovaries. ? Hysteroscopy. This test uses a lighted tube to check for problems in the cervix and the uterus. To be diagnosed with infertility, both partners will have a physical exam. Both partners will also have an extensive medical and sexual history taken. Additional tests may be done. How is this treated? Treatment depends on the cause of infertility. Most cases of infertility in women are treated with medicine or surgery.  Women may take medicine to: ? Correct ovulation problems. ? Treat other health conditions.  Surgery may be done to: ? Repair damage to the ovaries, fallopian tubes, cervix, or uterus. ? Remove growths from the uterus. ? Remove scar tissue from the uterus, pelvis, or other organs. Assisted reproductive technology (ART) Assisted reproductive technology (ART) refers to all treatments and procedures that combine eggs and sperm outside the body to try to help a couple conceive. ART is often combined with fertility drugs to stimulate ovulation. Sometimes ART is done using eggs retrieved from another woman's body (donor eggs) or from previously frozen fertilized eggs (embryos). There are different types of ART. These include:  Intrauterine insemination (IUI). A long, thin tube is used to place sperm directly into a woman's uterus. This procedure: ? Is effective for infertility caused by sperm problems, including low sperm count and low motility. ? Can be used in combination with fertility drugs.  In vitro fertilization (IVF). This is done when a woman's fallopian tubes are blocked or when a man has low sperm count. In this procedure: ? Fertility drugs are used to stimulate the ovaries to produce multiple eggs. ? Once mature, these eggs are removed from the body and combined with the sperm to be fertilized. ? The fertilized eggs are then placed into the woman's uterus. Follow these instructions at home:  Take  over-the-counter and prescription medicines only as told by your health care provider.  Do not use any products that contain nicotine or tobacco, such as cigarettes and e-cigarettes. If you need help quitting, ask your health care provider.  If you drink alcohol, limit how much you have to 1 drink a day.  Make dietary changes to lose weight or maintain a healthy weight. Work with your health care provider and a dietitian to set a weight-loss goal that is healthy and reasonable for you.  Seek support from a counselor or support group to talk about your concerns related to infertility. Couples counseling may be helpful for  you and your partner.  Practice stress reduction techniques that work well for you, such as regular physical activity, meditation, or deep breathing.  Keep all follow-up visits as told by your health care provider. This is important. Contact a health care provider if you:  Feel that stress is interfering with your life and relationships.  Have side effects from treatments for infertility. Summary  Female infertility refers to a woman's inability to get pregnant (conceive) after a year of having sex regularly (or after 6 months in women over age 47) without using birth control.  To be diagnosed with infertility, both partners will have a physical exam. Both partners will also have an extensive medical and sexual history taken.  Seek support from a counselor or support group to talk about your concerns related to infertility. Couples counseling may be helpful for you and your partner. This information is not intended to replace advice given to you by your health care provider. Make sure you discuss any questions you have with your health care provider. Document Revised: 12/29/2018 Document Reviewed: 08/09/2017 Elsevier Patient Education  [place].  Hysterosalpingography  Hysterosalpingography is a procedure in which a woman's uterus and fallopian tubes are  examined. During this procedure, contrast dye is injected into the uterus through the vagina and cervix. X-rays are then taken. The dye makes the uterus and fallopian tubes show up clearly on the X-rays. This procedure may be done:  To help determine whether there are tumors, scars (adhesions), or other abnormalities in the uterus.  To find out why a woman is unable to have children (infertile).  To make sure the fallopian tubes are completely blocked a few months after having certain tubal sterilization procedures. Tell a health care provider about:  Any allergies you have.  All medicines you are taking, including vitamins, herbs, eye drops, creams, and over-the-counter medicines.  Any problems you or family members have had with the use of anesthetic medicines.  Any blood disorders you have.  Any surgeries you have had.  Any medical conditions you have.  Whether you are pregnant or may be pregnant. What are the risks? Generally, this is a safe procedure. However, problems may occur, including:  Infection in the lining of the uterus (endometritis) or fallopian tubes (salpingitis).  Allergic reaction to medicines or dyes.  Risk of making a hole (perforation) in the uterus or fallopian tubes.  Damage to other structures or organs. What happens before the procedure?  Schedule the procedure after your menstrual period stops, but before your next ovulation. This is usually between day 5 and day 10 of your last period. Day 1 is the first day of your period.  Ask your health care provider about changing or stopping your regular medicines. This is especially important if you are taking diabetes medicines or blood thinners.  Empty your bladder before the procedure begins.  Plan to have someone take you home from the hospital or clinic. What happens during the procedure?  You may be given one of the following: ? A medicine to help you relax (sedative). ? An over-the-counter pain  medicine.  You will lie down on your back and place your feet into footrests (stirrups).  A device called a speculum will be inserted into your vagina. This allows your health care provider to see inside your vagina through to your cervix.  Your cervix will be washed with a germ-killing soap.  A medicine may be injected into your cervix to numb it (local anesthesia).  A thin, flexible tube will be passed through your cervix into your uterus.  Contrast dye will be passed through the tube and into the uterus. Contrast dye may cause some cramping.  Several X-rays will be taken as the contrast dye spreads through the uterus and into the fallopian tubes.  The tube will be removed. The contrast dye will flow out through your vagina naturally. The procedure may vary among health care providers and hospitals. What happens after the procedure?  Most of the contrast dye will flow out of your vagina naturally. You may want to wear a sanitary pad.  You may have mild cramping and vaginal bleeding. This should go away after a short time.  Do not drive for 24 hours if you were given a sedative.  It is up to you to get the results of your procedure. Ask your health care provider, or the department that is doing the procedure, when your results will be ready. Summary  Hysterosalpingography is a procedure in which a woman's uterus and fallopian tubes are examined.  During this procedure, contrast dye is injected into the uterus through the vagina and cervix. X-rays are then taken. The dye helps the uterus and fallopian tubes show up clearly on the X-rays.  Schedule the procedure after your menstrual period stops, but before your next ovulation. This is usually between day 5 and day 10 of your last period.  After the procedure, you may have mild cramping and vaginal bleeding. This should go away after a short time. This information is not intended to replace advice given to you by your health care  provider. Make sure you discuss any questions you have with your health care provider. Document Revised: 08/20/2017 Document Reviewed: 09/30/2016 Elsevier Patient Education  2020 [employer].

## 2020-01-22 NOTE — Progress Notes (Signed)
GYNECOLOGY  VISIT   HPI: 24 y.o.   Single White or Caucasian Hispanic or Latino  female   G0P0000 with Patient's last menstrual period was 12/17/2019.   here for  blood in stool on Saturday. Has had a bowel movement since and has not noticed any blood.  Last week she was constipated, she bleed 3 times after BM. Now BM are soft. Typically she has a normal BM daily and doesn't strain.   Sexually active, okay if she gets pregnant. With her fiance for 6 years, has never gotten pregnant, they are each other's only partner. Not using condoms or withdrawal.   She c/o increase vaginal d/c and odor for years.   Cycles every 1-3 months, bleeds x 3-4 days. Heavy x 2 days, saturates a pad in 4 hours. Slight cramps.  She reports an increase in acne, no hirsutism. No galactorrhea     GYNECOLOGIC HISTORY: Patient's last menstrual period was 12/17/2019. Contraception: none  Menopausal hormone therapy: none        OB History    Gravida  0   Para  0   Term  0   Preterm  0   AB  0   Living  0     SAB  0   TAB  0   Ectopic  0   Multiple  0   Live Births  0              Patient Active Problem List   Diagnosis Date Noted  . Abnormal uterine bleeding (AUB) 10/12/2018    History reviewed. No pertinent past medical history.  Past Surgical History:  Procedure Laterality Date  . APPENDECTOMY      No current outpatient medications on file.   No current facility-administered medications for this visit.     ALLERGIES: Patient has no known allergies.  Family History  Problem Relation Age of Onset  . Heart disease Maternal Grandmother   . Diabetes Paternal Grandmother     Social History   Socioeconomic History  . Marital status: Single    Spouse name: Not on file  . Number of children: Not on file  . Years of education: Not on file  . Highest education level: Not on file  Occupational History  . Not on file  Tobacco Use  . Smoking status: Never Smoker  .  Smokeless tobacco: Never Used  Substance and Sexual Activity  . Alcohol use: No  . Drug use: Never  . Sexual activity: Yes    Partners: Male    Birth control/protection: None  Other Topics Concern  . Not on file  Social History Narrative  . Not on file   Social Determinants of Health   Financial Resource Strain:   . Difficulty of Paying Living Expenses:   Food Insecurity:   . Worried About Charity fundraiser in the Last Year:   . Arboriculturist in the Last Year:   Transportation Needs:   . Film/video editor (Medical):   Marland Kitchen Lack of Transportation (Non-Medical):   Physical Activity:   . Days of Exercise per Week:   . Minutes of Exercise per Session:   Stress:   . Feeling of Stress :   Social Connections:   . Frequency of Communication with Friends and Family:   . Frequency of Social Gatherings with Friends and Family:   . Attends Religious Services:   . Active Member of Clubs or Organizations:   . Attends Club or  Organization Meetings:   Marland Kitchen Marital Status:   Intimate Partner Violence:   . Fear of Current or Ex-Partner:   . Emotionally Abused:   Marland Kitchen Physically Abused:   . Sexually Abused:     Review of Systems  Constitutional: Negative.   HENT: Negative.   Eyes: Negative.   Respiratory: Negative.   Cardiovascular: Negative.   Gastrointestinal: Positive for blood in stool.  Genitourinary: Negative.   Musculoskeletal: Negative.   Skin: Negative.   Neurological: Negative.   Endo/Heme/Allergies: Negative.   Psychiatric/Behavioral: Negative.     PHYSICAL EXAMINATION:    BP (!) 94/56 (BP Location: Right Arm, Patient Position: Sitting, Cuff Size: Normal)   Pulse 70   Temp 98.1 F (36.7 C) (Skin)   Resp 14   Ht 5\' 1"  (1.549 m)   Wt 146 lb 1.6 oz (66.3 kg)   LMP 12/17/2019   BMI 27.61 kg/m     General appearance: alert, cooperative and appears stated age  Pelvic: External genitalia:  no lesions              Urethra:  normal appearing urethra with no  masses, tenderness or lesions              Bartholins and Skenes: normal                 Vagina: normal appearing vagina with a slight increase in watery vaginal discharge.              Cervix: no lesions               Chaperone was present for exam.  ASSESSMENT Anal fissure Oligomenorrhea, primary Primary infertility C/o vaginal d/c with odor    PLAN Discussed anal fissure, take metamucil or magnesium for 6 weeks to let it heal TSH, prolactin, testosterone level Nuswab vaginitis panel Discussed infertility evaluation, given BBT charts, information and consent for semen analysis Discussed HSG and ovulation induction.    An After Visit Summary was printed and given to the patient.  ~30 minutes in total patient care.

## 2020-01-23 ENCOUNTER — Telehealth: Payer: Self-pay | Admitting: *Deleted

## 2020-01-23 DIAGNOSIS — R7989 Other specified abnormal findings of blood chemistry: Secondary | ICD-10-CM

## 2020-01-23 LAB — PROLACTIN: Prolactin: 26.1 ng/mL — ABNORMAL HIGH (ref 4.8–23.3)

## 2020-01-23 LAB — TESTOSTERONE: Testosterone: 26 ng/dL (ref 8–48)

## 2020-01-23 LAB — TSH: TSH: 4.43 u[IU]/mL (ref 0.450–4.500)

## 2020-01-23 NOTE — Telephone Encounter (Signed)
Patient returned a call to JIll. °

## 2020-01-23 NOTE — Telephone Encounter (Signed)
-----   Message from Romualdo Bolk, MD sent at 01/23/2020 11:50 AM EDT ----- Valentina Gu: Please add a BhcG onto yesterdays lab work. Malike Foglio: Please let the patient know that her prolactin level is mildly elevated and her TSH is high normal. We are adding a BhcG onto her lab work from yesterday. As long as that is negative, then please have her come back for a fasting prolactin level and renal panel. I would repeat her TSH in 6-12 months.

## 2020-01-23 NOTE — Telephone Encounter (Signed)
Spoke with patient, advised of results as seen below per Dr. Oscar La.   Fasting lab scheduled for 5/6 at 8:45am. Advised patient nothing to eat or drink after midnight night before labs, can have water and/or black coffee. No vigorous exercise, intercourse or nipple stimulation 24 hours prior to appt.   Advised patient our office will notify of BHcg once completed.   Patient request to proceed with rpt TSH in 6 months. Lab appt scheduled for 08/08/20 at 3:45pm.   Patient verbalizes understanding and is agreeable.   Future lab order placed for prolactin and TSH.   Lab order pended for renal function.   Dr. Oscar La -please confirm Dx for renal panel?

## 2020-01-23 NOTE — Telephone Encounter (Signed)
Leda Min, RN  01/23/2020 12:47 PM EDT    Left message to call Noreene Larsson, RN at [facility] [phone].

## 2020-01-23 NOTE — Telephone Encounter (Signed)
Leda Min, RN  01/23/2020 3:30 PM EDT    Confirmed with Lucy, Beta Hcg added

## 2020-01-24 LAB — NUSWAB VAGINITIS (VG)
Candida albicans, NAA: NEGATIVE
Candida glabrata, NAA: NEGATIVE
Trich vag by NAA: NEGATIVE

## 2020-01-24 LAB — SPECIMEN STATUS REPORT

## 2020-01-24 LAB — BETA HCG QUANT (REF LAB): hCG Quant: <1 m[IU]/mL

## 2020-01-24 NOTE — Telephone Encounter (Signed)
Leda Min, RN  01/24/2020 1:52 PM EDT    Call to patient, left detailed message, ok per dpr. Advised additional lab added was negative, ok to proceed with lab appt as scheduled for 01/25/20. Return call to office if any additional questions

## 2020-01-25 ENCOUNTER — Other Ambulatory Visit (INDEPENDENT_AMBULATORY_CARE_PROVIDER_SITE_OTHER): Payer: BC Managed Care – PPO

## 2020-01-25 ENCOUNTER — Other Ambulatory Visit: Payer: Self-pay

## 2020-01-25 DIAGNOSIS — R7989 Other specified abnormal findings of blood chemistry: Secondary | ICD-10-CM

## 2020-01-26 LAB — RENAL FUNCTION PANEL
Albumin: 4.2 g/dL (ref 3.9–5.0)
BUN/Creatinine Ratio: 15 (ref 9–23)
BUN: 10 mg/dL (ref 6–20)
CO2: 23 mmol/L (ref 20–29)
Calcium: 9 mg/dL (ref 8.7–10.2)
Chloride: 104 mmol/L (ref 96–106)
Creatinine, Ser: 0.67 mg/dL (ref 0.57–1.00)
GFR calc Af Amer: 144 mL/min/{1.73_m2} (ref >59)
GFR calc non Af Amer: 125 mL/min/{1.73_m2} (ref >59)
Glucose: 85 mg/dL (ref 65–99)
Phosphorus: 3.5 mg/dL (ref 3.0–4.3)
Potassium: 4.2 mmol/L (ref 3.5–5.2)
Sodium: 138 mmol/L (ref 134–144)

## 2020-01-26 LAB — PROLACTIN: Prolactin: 23.8 ng/mL — ABNORMAL HIGH (ref 4.8–23.3)

## 2020-01-26 LAB — TSH: TSH: 2.54 u[IU]/mL (ref 0.450–4.500)

## 2020-01-30 ENCOUNTER — Telehealth: Payer: Self-pay | Admitting: *Deleted

## 2020-01-30 DIAGNOSIS — R7989 Other specified abnormal findings of blood chemistry: Secondary | ICD-10-CM

## 2020-01-30 NOTE — Telephone Encounter (Signed)
Spoke with patient, advised of results as seen below per Dr. Oscar La. Patient agreeable to proceed with MRI Brain w/wo contrast, order placed at [facility]. Advised patient [facility] will contact her directly to schedule, our office will precert once scheduled. Patient verbalizes understanding and is agreeable.   Placed in [facility] hold.   Routing to [employer] for [employer].   Encounter closed.

## 2020-01-30 NOTE — Telephone Encounter (Signed)
-----   Message from Romualdo Bolk, MD sent at 01/29/2020  1:01 PM EDT ----- Please let the patient know that her prolactin level is still mildly elevated. The rest of her blood work is normal. The recommendation is a MRI of the pituitary with contrast. Please let her know that occasionally there is a small benign tumor in the brain that can cause the prolactin to be elevated, this needs to be ruled out. If there was a tumor, it would likely just be followed, sometimes treated with medication.

## 2020-02-28 ENCOUNTER — Other Ambulatory Visit: Payer: Self-pay

## 2020-02-28 ENCOUNTER — Ambulatory Visit
Admission: RE | Admit: 2020-02-28 | Discharge: 2020-02-28 | Disposition: A | Discharge status: Home / Self Care | Payer: BC Managed Care – PPO | Source: Ambulatory Visit | Attending: Obstetrics and Gynecology | Admitting: Obstetrics and Gynecology

## 2020-02-28 DIAGNOSIS — E221 Hyperprolactinemia: Secondary | ICD-10-CM | POA: Diagnosis not present

## 2020-02-28 DIAGNOSIS — R7989 Other specified abnormal findings of blood chemistry: Secondary | ICD-10-CM

## 2020-02-28 MED ORDER — GADOBENATE DIMEGLUMINE 529 MG/ML IV SOLN
7.0000 mL | Freq: Once | INTRAVENOUS | Status: AC | PRN
  Administered 2020-02-28: 7 mL via INTRAVENOUS

## 2020-04-02 ENCOUNTER — Telehealth: Payer: Self-pay

## 2020-04-02 NOTE — Telephone Encounter (Signed)
Spoke with patient. Patient reports increased acne on her face and chin during the summer months, requesting a referral to dermatology. Patient denies any GYN concerns.  Provided patient contact information for Dermatology Specialist and [facility]. Advised patient she should not need a referral to schedule, she can contact them directly to schedule. Questions answered. Patient is aware to return call to office if any additional assistance is needed. Patient verbalizes understanding and is agreeable.   Routing to provider for final review. Patient is agreeable to disposition. Will close encounter.

## 2020-04-02 NOTE — Telephone Encounter (Signed)
Patient is calling in regards to wanting dermatologist recommendations.

## 2020-04-17 ENCOUNTER — Telehealth: Payer: Self-pay | Admitting: Obstetrics and Gynecology

## 2020-04-17 NOTE — Telephone Encounter (Signed)
Patient's cycle has not been on in 2 months.

## 2020-04-17 NOTE — Telephone Encounter (Signed)
Spoke with patient. LMP 02/09/20. Has gained approximately 10 lbs in 2 months. SA, no contraceptive. UPT negative last week. Denies any other GYN symptoms or concerns.   OV scheduled for 04/18/20 at 9:30am with Dr. Oscar La.   Routing to provider for final review. Patient is agreeable to disposition. Will close encounter.

## 2020-04-17 NOTE — Telephone Encounter (Signed)
Patient is returning call. Patient states she gets off at 3:00pm today.

## 2020-04-17 NOTE — Telephone Encounter (Signed)
Left message to call Noreene Larsson, RN at [facility] [phone].   Last AEX w/ DL 26/33/35  Brain MRI completed on 02/29/20 for elevated prolactin level, normal.

## 2020-04-18 ENCOUNTER — Encounter: Payer: Self-pay | Admitting: Obstetrics and Gynecology

## 2020-04-18 ENCOUNTER — Ambulatory Visit (INDEPENDENT_AMBULATORY_CARE_PROVIDER_SITE_OTHER): Payer: BC Managed Care – PPO | Admitting: Obstetrics and Gynecology

## 2020-04-18 ENCOUNTER — Other Ambulatory Visit: Payer: Self-pay

## 2020-04-18 VITALS — BP 100/56 | HR 66 | Resp 14 | Ht 61.0 in | Wt 152.1 lb

## 2020-04-18 DIAGNOSIS — R5383 Other fatigue: Secondary | ICD-10-CM

## 2020-04-18 DIAGNOSIS — R635 Abnormal weight gain: Secondary | ICD-10-CM | POA: Diagnosis not present

## 2020-04-18 DIAGNOSIS — N913 Primary oligomenorrhea: Secondary | ICD-10-CM

## 2020-04-18 LAB — POCT URINE PREGNANCY: Preg Test, Ur: NEGATIVE

## 2020-04-18 MED ORDER — MEDROXYPROGESTERONE ACETATE 5 MG PO TABS: ORAL_TABLET | ORAL | 1 refills | Status: AC

## 2020-04-18 NOTE — Patient Instructions (Signed)
Provera 5 mg x 5 days now and every 2 months if no spontaneous menses (must not have unprotected intercourse for 2 weeks prior to taking provera and have a negative pregnancy test)

## 2020-04-18 NOTE — Progress Notes (Signed)
GYNECOLOGY  VISIT   HPI: 24 y.o.   Single White or Caucasian Hispanic or Latino  female   G0P0000 with Patient's last menstrual period was 02/09/2020.   here for no menses for 2 months and having weight gain.   She has a long h/o oligomenorrhea. W/U earlier this year showed a normal total testosterone level, a normal TSH and a slightly elevated prolactin level of 26.1. Repeat prolactin level was just over normal at 23.8. A brain MRI was normal.  She has a h/o primary oligomenorrhea, typically has a cycle every 1-2 months. Not usually skipping more than 2 months.  Same long term partner, using w/d for contraception. Okay if she gets pregnant, no longer wanting to actively pursue.  She has gained 6 lbs in the last 2 months. She is eating less. She isn't exercising. No breast tenderness.  Last sexually active 2 weeks ago. She is c/o increasing fatigue. She c/o increase in acne.   She had an anal fissure in early May, 2021. The anal fissure is better, stools are softer.   GYNECOLOGIC HISTORY: Patient's last menstrual period was 02/09/2020. Contraception: none  Menopausal hormone therapy: N/A       OB History    Gravida  0   Para  0   Term  0   Preterm  0   AB  0   Living  0     SAB  0   TAB  0   Ectopic  0   Multiple  0   Live Births  0              Patient Active Problem List   Diagnosis Date Noted  . Abnormal uterine bleeding (AUB) 10/12/2018    History reviewed. No pertinent past medical history.  Past Surgical History:  Procedure Laterality Date  . APPENDECTOMY      No current outpatient medications on file.   No current facility-administered medications for this visit.     ALLERGIES: Patient has no known allergies.  Family History  Problem Relation Age of Onset  . Heart disease Maternal Grandmother   . Diabetes Paternal Grandmother     Social History   Socioeconomic History  . Marital status: Single    Spouse name: Not on file  . Number  of children: Not on file  . Years of education: Not on file  . Highest education level: Not on file  Occupational History  . Not on file  Tobacco Use  . Smoking status: Never Smoker  . Smokeless tobacco: Never Used  Vaping Use  . Vaping Use: Never used  Substance and Sexual Activity  . Alcohol use: No  . Drug use: Never  . Sexual activity: Yes    Partners: Male    Birth control/protection: None  Other Topics Concern  . Not on file  Social History Narrative  . Not on file   Social Determinants of Health   Financial Resource Strain:   . Difficulty of Paying Living Expenses:   Food Insecurity:   . Worried About Programme researcher, broadcasting/film/video in the Last Year:   . Barista in the Last Year:   Transportation Needs:   . Freight forwarder (Medical):   Marland Kitchen Lack of Transportation (Non-Medical):   Physical Activity:   . Days of Exercise per Week:   . Minutes of Exercise per Session:   Stress:   . Feeling of Stress :   Social Connections:   . Frequency  of Communication with Friends and Family:   . Frequency of Social Gatherings with Friends and Family:   . Attends Religious Services:   . Active Member of Clubs or Organizations:   . Attends Banker Meetings:   Marland Kitchen Marital Status:   Intimate Partner Violence:   . Fear of Current or Ex-Partner:   . Emotionally Abused:   Marland Kitchen Physically Abused:   . Sexually Abused:     Review of Systems  Constitutional:       Weight gain   HENT: Negative.   Eyes: Negative.   Respiratory: Negative.   Cardiovascular: Negative.   Gastrointestinal: Negative.   Genitourinary:       Irregular periods   Musculoskeletal: Negative.   Skin: Negative.   Neurological: Negative.   Endo/Heme/Allergies: Negative.   Psychiatric/Behavioral: Negative.     PHYSICAL EXAMINATION:    BP (!) 100/56 (BP Location: Right Arm, Patient Position: Sitting, Cuff Size: Normal)   Pulse 66   Resp 14   Ht 5\' 1"  (1.549 m)   Wt 152 lb 1.6 oz (69 kg)    LMP 02/09/2020   BMI 28.74 kg/m     General appearance: alert, cooperative and appears stated age Neck: no adenopathy, supple, symmetrical, trachea midline and thyroid normal to inspection and palpation  ASSESSMENT Primary oligomenorrhea, typically doesn't go more than 2 months without a cycle. Minimally elevated prolactin, normal brain MRI.  C/O weight gain and fatigue She is holding off on pregnancy at this time. H/O primary infertility. If she pursues treatment will do HSG and Semen Analysis.     PLAN CBC, TSH UPT negative, not sexually active x 2 weeks.  Provera 5 mg x 5 days now and every 2 months if no spontaneous menses (must not have unprotected intercourse for 2 weeks prior to taking provera and have a negative pregnancy test)    An After Visit Summary was printed and given to the patient.

## 2020-04-19 LAB — CBC
Hematocrit: 40.6 % (ref 34.0–46.6)
Hemoglobin: 13.6 g/dL (ref 11.1–15.9)
MCH: 30.2 pg (ref 26.6–33.0)
MCHC: 33.5 g/dL (ref 31.5–35.7)
MCV: 90 fL (ref 79–97)
Platelets: 304 10*3/uL (ref 150–450)
RBC: 4.5 x10E6/uL (ref 3.77–5.28)
RDW: 12.5 % (ref 11.7–15.4)
WBC: 5.6 10*3/uL (ref 3.4–10.8)

## 2020-04-19 LAB — TSH: TSH: 3.4 u[IU]/mL (ref 0.450–4.500)

## 2020-06-20 DIAGNOSIS — L700 Acne vulgaris: Secondary | ICD-10-CM | POA: Diagnosis not present

## 2020-08-08 ENCOUNTER — Other Ambulatory Visit: Payer: Self-pay

## 2020-09-19 DIAGNOSIS — L700 Acne vulgaris: Secondary | ICD-10-CM | POA: Diagnosis not present

## 2021-04-10 DIAGNOSIS — E663 Overweight: Secondary | ICD-10-CM | POA: Diagnosis not present

## 2021-04-10 DIAGNOSIS — R7989 Other specified abnormal findings of blood chemistry: Secondary | ICD-10-CM | POA: Diagnosis not present

## 2021-04-10 DIAGNOSIS — Z0000 Encounter for general adult medical examination without abnormal findings: Secondary | ICD-10-CM | POA: Diagnosis not present

## 2021-04-10 DIAGNOSIS — Z113 Encounter for screening for infections with a predominantly sexual mode of transmission: Secondary | ICD-10-CM | POA: Diagnosis not present

## 2021-08-12 DIAGNOSIS — N898 Other specified noninflammatory disorders of vagina: Secondary | ICD-10-CM | POA: Diagnosis not present

## 2021-08-12 DIAGNOSIS — R300 Dysuria: Secondary | ICD-10-CM | POA: Diagnosis not present

## 2021-10-20 IMAGING — MR MR HEAD WO/W CM
16 of 19 series · 34 of 48 positions shown · IV contrast (7 multihance)
Comparison: None.

CLINICAL DATA: Hyperprolactinemia

EXAM:
MRI HEAD WITHOUT AND WITH CONTRAST
TECHNIQUE: Multiplanar, multiecho pulse sequences of the brain and surrounding
structures were obtained without and with intravenous contrast.
CONTRAST:  7mL MULTIHANCE GADOBENATE DIMEGLUMINE 529 MG/ML IV SOLN

[Series 2: T1 · sagittal · 5.0mm · 0.45mm/px · 1 of 22 slices shown]
[im 1/22]
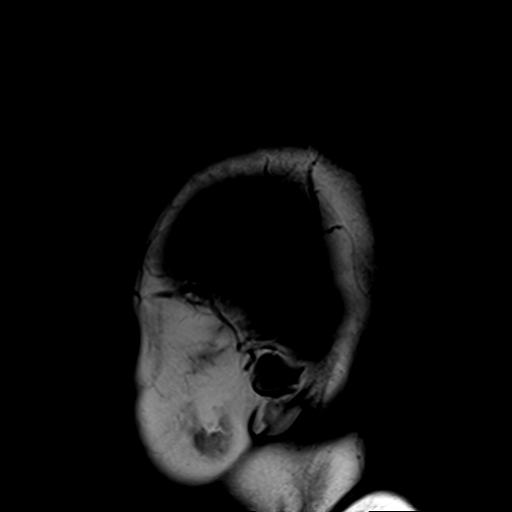

[Series 3: DWI · axial · 3.0mm · 1.80mm/px · z∈[-34,+112]mm · 8 of 100 slices shown]
[im 1/100]
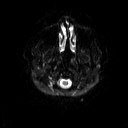
[im 12/100]
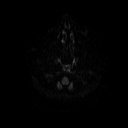
[im 34/100]
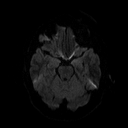
[im 45/100]
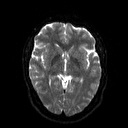
[im 56/100]
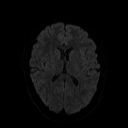
[im 67/100]
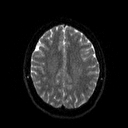
[im 89/100]
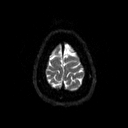
[im 100/100]
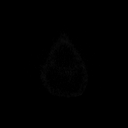

[Series 4: dwi_adc · axial · 3.0mm · 1.80mm/px · z∈[-34,+112]mm · 5 of 50 slices shown]
[im 1/50]
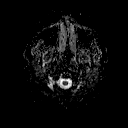
[im 13/50]
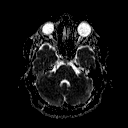
[im 25/50]
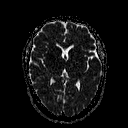
[im 37/50]
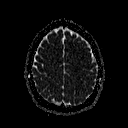
[im 50/50]
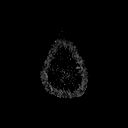

[Series 5: T2 · axial · 5.0mm · 0.36mm/px · z∈[-50,+99]mm · 3 of 24 slices shown]
[im 1/24]
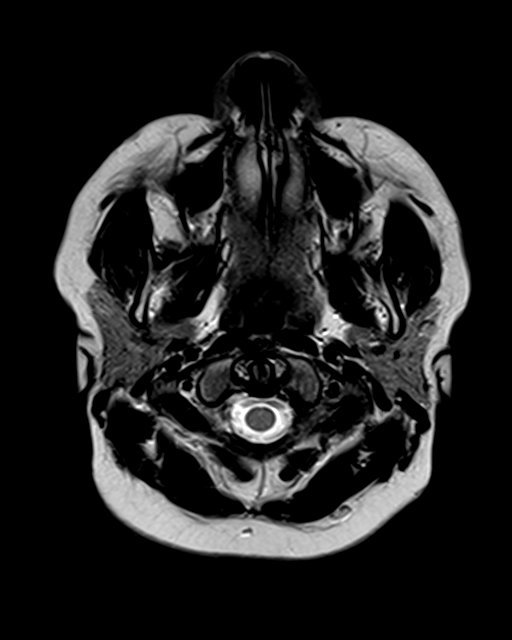
[im 12/24]
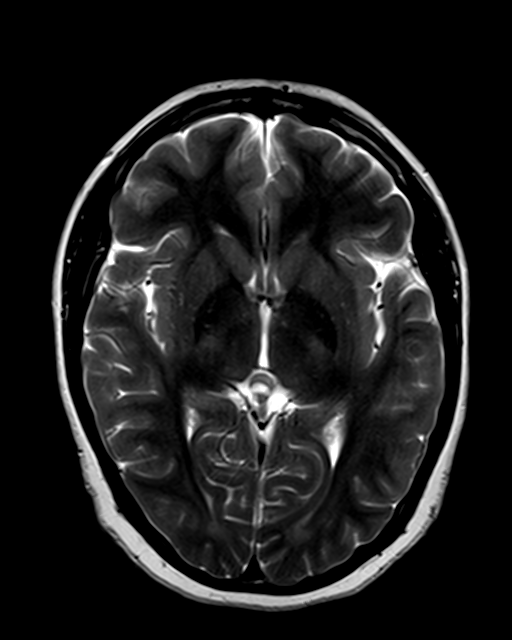
[im 24/24]
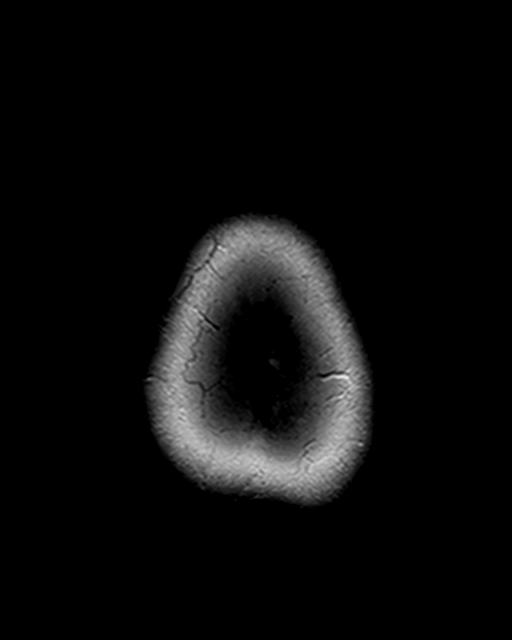

[Series 6: FLAIR · axial · 3.0mm · 0.45mm/px · z∈[-50,+99]mm · 3 of 33 slices shown]
[im 1/33]
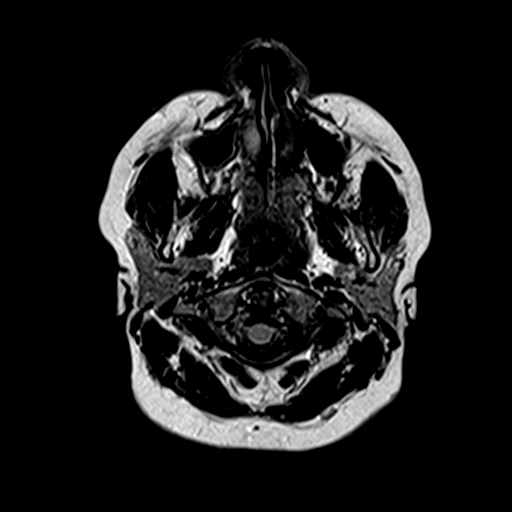
[im 17/33]
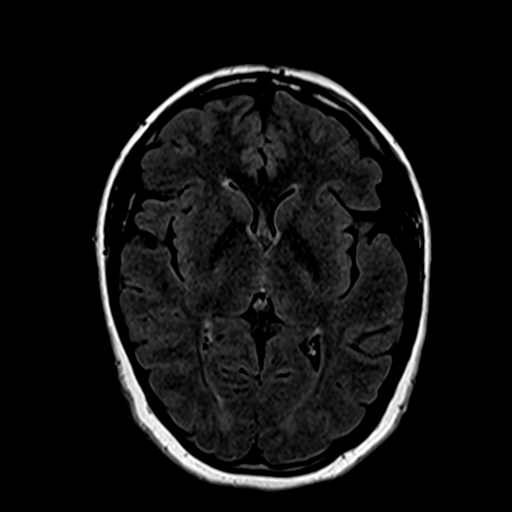
[im 33/33]
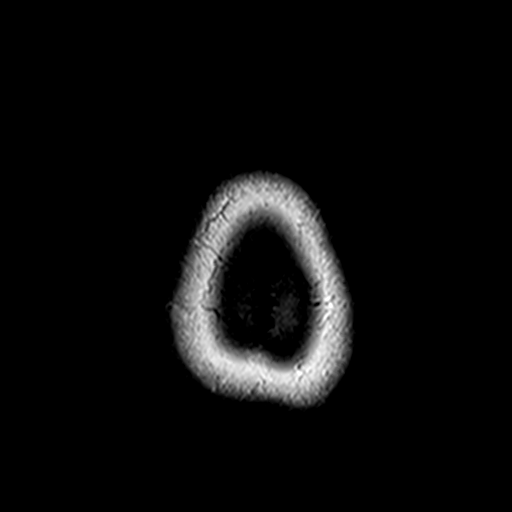

[Series 8: swi_images · axial · 4.0mm · 0.94mm/px · z∈[-45,+94]mm · 4 of 36 slices shown]
[im 1/36]
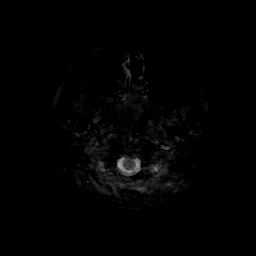
[im 12/36]
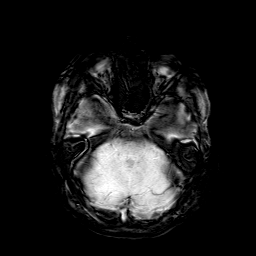
[im 24/36]
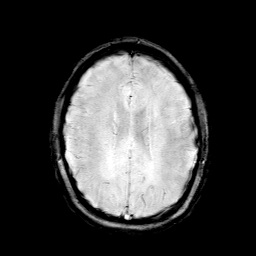
[im 36/36]
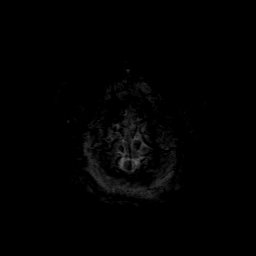

[Series 9: sag 3mm · sagittal · 3.0mm · 0.33mm/px · 1 of 11 slices shown]
[im 1/11]
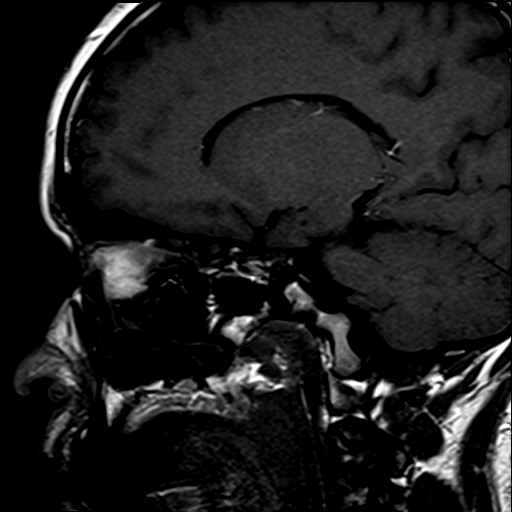

[Series 10: cor 3mm · coronal · 3.0mm · 0.33mm/px · 1 of 10 slices shown]
[im 1/10]
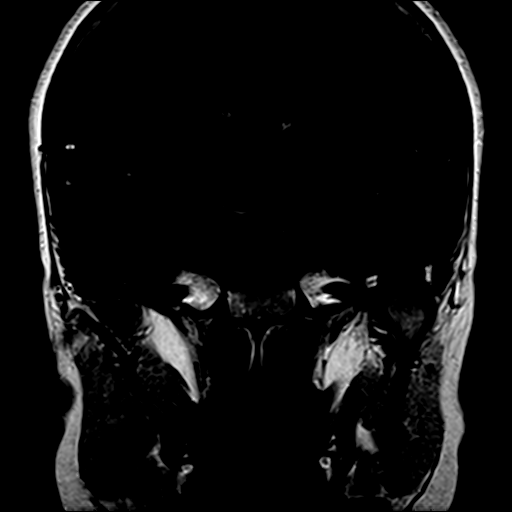

[Series 11: pre cor dynamic · coronal · non-contrast · 3.0mm · 0.35mm/px · 1 of 7 slices shown]
[im 1/7]
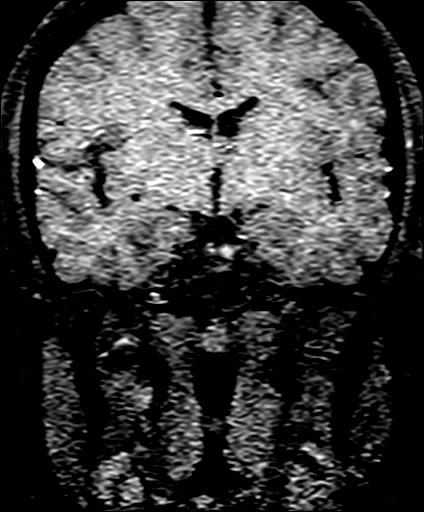

[Series 12: post fs cor · coronal · 3.0mm · 0.35mm/px · 1 of 7 slices shown (1 of 6)]
[im 1/7]
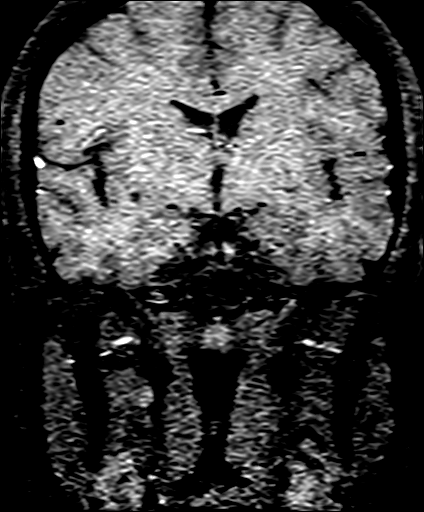

[Series 13: post fs cor · coronal · 3.0mm · 0.35mm/px · 1 of 7 slices shown (2 of 6)]
[im 1/7]
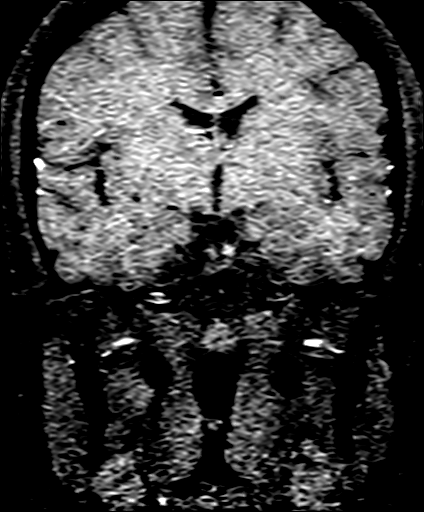

[Series 14: post fs cor · coronal · 3.0mm · 0.35mm/px · 1 of 7 slices shown (3 of 6)]
[im 1/7]
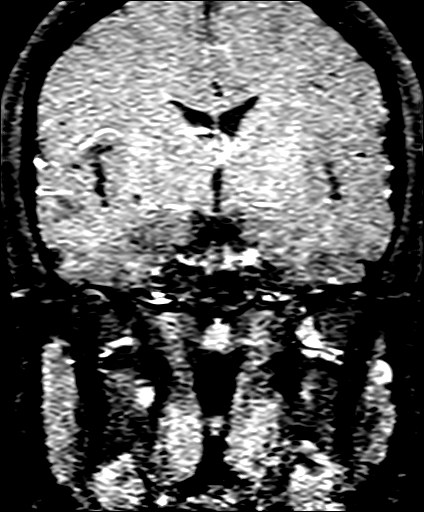

[Series 15: post fs cor · coronal · 3.0mm · 0.35mm/px · 1 of 7 slices shown (4 of 6)]
[im 1/7]
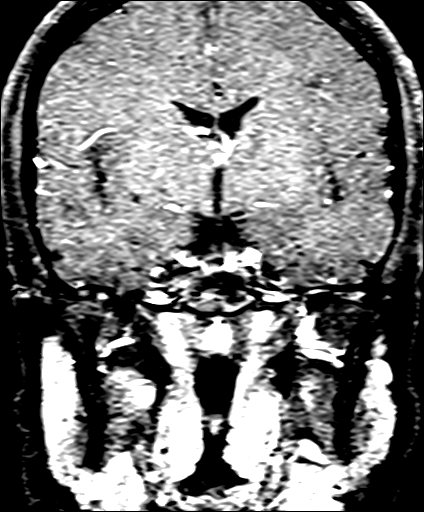

[Series 16: post fs cor · coronal · 3.0mm · 0.35mm/px · 1 of 7 slices shown (5 of 6)]
[im 1/7]
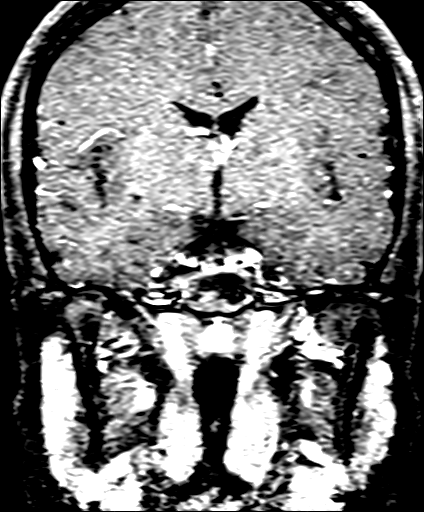

[Series 17: post fs cor · coronal · 3.0mm · 0.35mm/px · 1 of 7 slices shown (6 of 6)]
[im 1/7]
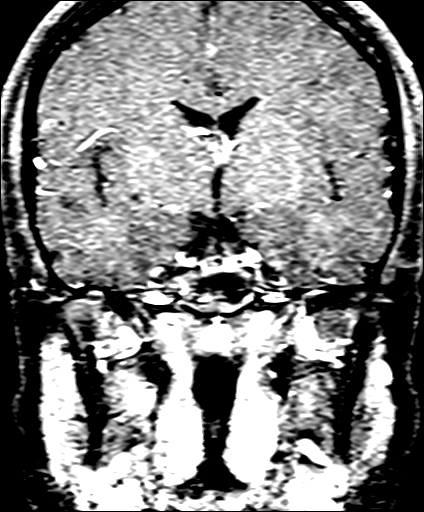

[Series 18: post sag 3mm · sagittal · 3.0mm · 0.33mm/px · 1 of 11 slices shown]
[im 1/11]
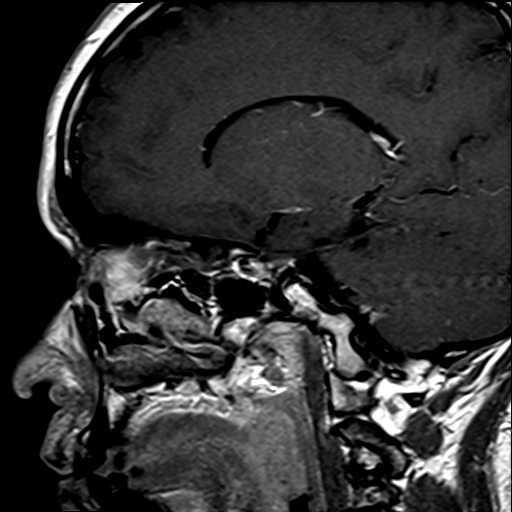

[34 of 48 positions shown; findings below may reference images not displayed]

FINDINGS: BRAIN: No acute infarct, acute hemorrhage or extra-axial collection.
Normal white matter signal. Normal volume of CSF spaces. No chronic
microhemorrhage. Normal midline structures.

Pituitary/Sella: The pituitary gland is normal in appearance without
mass lesion. A normal posterior pituitary bright spot is seen. The
infundibulum is midline. The hypothalamus and mamillary bodies are
normal. There is no mass effect on the optic chiasm or optic nerves.
The infundibular and chiasmatic recesses are clear. Normal cavernous
sinus and cavernous internal carotid artery flow voids.

VASCULAR: Major flow voids are preserved.

SKULL AND UPPER CERVICAL SPINE: Normal calvarium and skull base.
Visualized upper cervical spine and soft tissues are normal.

SINUSES/ORBITS: No paranasal sinus fluid levels or advanced mucosal
thickening. No mastoid or middle ear effusion. Normal orbits.
IMPRESSION: Normal MRI of the brain and pituitary gland.
